# Patient Record
Sex: Male | Born: 2005 | Race: Black or African American | Hispanic: No | Marital: Single | Smoking: Never smoker
Health system: Southern US, Community
[De-identification: ages and names within clinical notes are randomized; demographics above are authoritative.]

## PROBLEM LIST (undated history)

## (undated) DIAGNOSIS — R569 Unspecified convulsions: Secondary | ICD-10-CM

## (undated) DIAGNOSIS — F319 Bipolar disorder, unspecified: Secondary | ICD-10-CM

## (undated) DIAGNOSIS — F840 Autistic disorder: Secondary | ICD-10-CM

## (undated) DIAGNOSIS — R519 Headache, unspecified: Secondary | ICD-10-CM

## (undated) DIAGNOSIS — F209 Schizophrenia, unspecified: Secondary | ICD-10-CM

## (undated) HISTORY — PX: CIRCUMCISION: SHX1350

---
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
## 2023-03-10 ENCOUNTER — Emergency Department (HOSPITAL_COMMUNITY)
Admission: EM | Admit: 2023-03-10 | Discharge: 2023-03-11 | Disposition: A | Discharge status: Other Acute Inpatient Hospital | Payer: MEDICAID | Attending: Emergency Medicine | Admitting: Emergency Medicine

## 2023-03-10 ENCOUNTER — Other Ambulatory Visit: Payer: Self-pay

## 2023-03-10 ENCOUNTER — Encounter (HOSPITAL_COMMUNITY): Payer: Self-pay | Admitting: *Deleted

## 2023-03-10 DIAGNOSIS — F200 Paranoid schizophrenia: Secondary | ICD-10-CM | POA: Diagnosis not present

## 2023-03-10 DIAGNOSIS — R4689 Other symptoms and signs involving appearance and behavior: Secondary | ICD-10-CM | POA: Insufficient documentation

## 2023-03-10 LAB — COMPREHENSIVE METABOLIC PANEL
ALT: 88 U/L — ABNORMAL HIGH (ref 0–44)
AST: 64 U/L — ABNORMAL HIGH (ref 15–41)
Albumin: 4.4 g/dL (ref 3.5–5.0)
Alkaline Phosphatase: 79 U/L (ref 38–126)
Anion gap: 11 (ref 5–15)
BUN: 10 mg/dL (ref 6–20)
CO2: 21 mmol/L — ABNORMAL LOW (ref 22–32)
Calcium: 9.4 mg/dL (ref 8.9–10.3)
Chloride: 104 mmol/L (ref 98–111)
Creatinine, Ser: 1.3 mg/dL — ABNORMAL HIGH (ref 0.61–1.24)
GFR, Estimated: >60 mL/min (ref >60)
Glucose, Bld: 103 mg/dL — ABNORMAL HIGH (ref 70–99)
Potassium: 3.4 mmol/L — ABNORMAL LOW (ref 3.5–5.1)
Sodium: 136 mmol/L (ref 135–145)
Total Bilirubin: 0.6 mg/dL (ref 0.0–1.2)
Total Protein: 7.9 g/dL (ref 6.5–8.1)

## 2023-03-10 LAB — CBC WITH DIFFERENTIAL/PLATELET
Abs Immature Granulocytes: 0.01 10*3/uL (ref 0.00–0.07)
Basophils Absolute: 0.1 10*3/uL (ref 0.0–0.1)
Basophils Relative: 1 %
Eosinophils Absolute: 0.1 10*3/uL (ref 0.0–0.5)
Eosinophils Relative: 1 %
HCT: 45.6 % (ref 39.0–52.0)
Hemoglobin: 15.9 g/dL (ref 13.0–17.0)
Immature Granulocytes: 0 %
Lymphocytes Relative: 29 %
Lymphs Abs: 2 10*3/uL (ref 0.7–4.0)
MCH: 29.6 pg (ref 26.0–34.0)
MCHC: 34.9 g/dL (ref 30.0–36.0)
MCV: 84.9 fL (ref 80.0–100.0)
Monocytes Absolute: 0.5 10*3/uL (ref 0.1–1.0)
Monocytes Relative: 7 %
Neutro Abs: 4.2 10*3/uL (ref 1.7–7.7)
Neutrophils Relative %: 62 %
Platelets: 363 10*3/uL (ref 150–400)
RBC: 5.37 MIL/uL (ref 4.22–5.81)
RDW: 12.4 % (ref 11.5–15.5)
WBC: 6.8 10*3/uL (ref 4.0–10.5)
nRBC: 0 % (ref 0.0–0.2)

## 2023-03-10 LAB — RAPID URINE DRUG SCREEN, HOSP PERFORMED
Amphetamines: NOT DETECTED
Barbiturates: NOT DETECTED
Benzodiazepines: NOT DETECTED
Cocaine: NOT DETECTED
Opiates: NOT DETECTED
Tetrahydrocannabinol: POSITIVE — AB

## 2023-03-10 LAB — ETHANOL: Alcohol, Ethyl (B): <10 mg/dL (ref <10)

## 2023-03-10 LAB — ACETAMINOPHEN LEVEL: Acetaminophen (Tylenol), Serum: <10 ug/mL — ABNORMAL LOW (ref 10–30)

## 2023-03-10 LAB — SALICYLATE LEVEL: Salicylate Lvl: <7 mg/dL — ABNORMAL LOW (ref 7.0–30.0)

## 2023-03-10 MED ORDER — RISPERIDONE 3 MG PO TABS
3.0000 mg | ORAL_TABLET | Freq: Every day | ORAL | Status: DC
  Administered 2023-03-10: 3 mg via ORAL
  Filled 2023-03-10 (×2): qty 1

## 2023-03-10 MED ORDER — ZIPRASIDONE MESYLATE 20 MG IM SOLR
20.0000 mg | Freq: Once | INTRAMUSCULAR | Status: AC
Start: 2023-03-10 — End: 2023-03-10
  Administered 2023-03-10: 20 mg via INTRAMUSCULAR
  Filled 2023-03-10: qty 20

## 2023-03-10 MED ORDER — HYDROXYZINE HCL 10 MG PO TABS: 10.0000 mg | ORAL_TABLET | Freq: Every day | ORAL | Status: DC | PRN

## 2023-03-10 MED ORDER — LORAZEPAM 2 MG/ML IJ SOLN
4.0000 mg | Freq: Once | INTRAMUSCULAR | Status: AC
  Administered 2023-03-10: 4 mg via INTRAMUSCULAR

## 2023-03-10 MED ORDER — ZIPRASIDONE MESYLATE 20 MG IM SOLR
20.0000 mg | Freq: Once | INTRAMUSCULAR | Status: AC | PRN
  Administered 2023-03-10: 20 mg via INTRAMUSCULAR
  Filled 2023-03-10: qty 20

## 2023-03-10 MED ORDER — HYDROXYZINE HCL 25 MG PO TABS: 25.0000 mg | ORAL_TABLET | Freq: Every day | ORAL | Status: DC | PRN

## 2023-03-10 MED ORDER — STERILE WATER FOR INJECTION IJ SOLN
INTRAMUSCULAR | Status: AC
  Administered 2023-03-10: 10 mL
  Filled 2023-03-10: qty 10

## 2023-03-10 MED ORDER — IBUPROFEN 400 MG PO TABS: 400.0000 mg | ORAL_TABLET | Freq: Four times a day (QID) | ORAL | Status: DC | PRN

## 2023-03-10 MED ORDER — LORAZEPAM 2 MG/ML IJ SOLN
INTRAMUSCULAR | Status: AC
  Administered 2023-03-10: 4 mg via INTRAMUSCULAR
  Filled 2023-03-10: qty 3

## 2023-03-10 MED ORDER — LORAZEPAM 2 MG/ML IJ SOLN: 4.0000 mg | Freq: Once | INTRAMUSCULAR | Status: DC

## 2023-03-10 MED ORDER — TRAZODONE HCL 50 MG PO TABS
50.0000 mg | ORAL_TABLET | Freq: Every evening | ORAL | Status: DC | PRN
  Administered 2023-03-10: 100 mg via ORAL
  Filled 2023-03-10: qty 2

## 2023-03-10 NOTE — ED Notes (Signed)
Patient asking for food. Tech offered sandwich and graham crackers. Patient states "I do not eat processed foods". RN explains the options we have to eat down here and patient states "I guess I will not eat then".

## 2023-03-10 NOTE — ED Notes (Signed)
Pt mother called and pt refused to talk to her on the desk phone, mother advised she would call back later.

## 2023-03-10 NOTE — Progress Notes (Signed)
[name] Progress Note  [id number]   Joseph Key  03/10/2023  1:56 PM  Description:   Inpatient Psychiatric Referral  Patient was recommended inpatient per [name]. There are no available beds at [facility], per [facility] [name]. Patient was referred to the following out of network facilities:   Destination  Service Provider Address Phone Fax  [facility] [address] [place] [address] [phone]  [facility] [address], [place] [address] [phone]  [facility] [address] [place] [address] [phone]  [facility] [address] [place], [place] [address] [phone]  [facility] [address] [place] [address] [phone]  [facility] [address] [place] [address] [phone]  [facility] [place] [address], [place] [address] [phone]  [facility] [address] [place] [address] [phone]  [facility] [address] [place] [address] [phone]  [facility] EFAX [address] [place] [phone]  [facility] [address] [place] [address] [phone]  [facility] [address] [place] [address] [phone]  [facility] [address] [place], [place] [address] [phone]      Situation ongoing, CSW to continue following and update chart as more information becomes available.      [name], [name], [name]  03/10/2023 1:56 PM
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
## 2023-03-10 NOTE — Progress Notes (Addendum)
Pt was accepted to [facility] TOMORROW 03/11/2023; Bed Assignment [facility] Fax Number: [phone] (Adult)  Pt meets inpatient criteria per Madelin Rear  Attending Physician will be Dr. Loni Beckwith   Report can be called [phone]-Pager number, please leave a returned phone number to receive a phone call back.   Pt can arrive after 8:30am   Care Team notified: Yetta Barre  Maryjean Ka, [name], [facility] 03/10/2023 6:37 PM
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
## 2023-03-10 NOTE — ED Notes (Signed)
Mother updated on pt being accepted at [facility].
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
## 2023-03-10 NOTE — BH Assessment (Signed)
TTS Consult will be completed by IRIS. IRIS Coordinator will communicate in established chat assessment time and provider name. Thanks

## 2023-03-10 NOTE — ED Notes (Signed)
ETA for TTS is 08:30 AM EST

## 2023-03-10 NOTE — ED Notes (Signed)
Pt changed into purple scrubs; cooperative at present  Trazodone Risperidone
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
## 2023-03-10 NOTE — ED Notes (Signed)
NT gave Hall-[address] a [place] sandwich and a cup of ice water.

## 2023-03-10 NOTE — ED Notes (Signed)
IVC COMPLETE EXP 2/4 3 COPIES ATTACHED TO CLIPBOARD IN BLUE ZONE ORIGINAL IN RED FOLDER AND ONE IN MEDICAL RECORDS.

## 2023-03-10 NOTE — ED Notes (Signed)
Dr. Leanna Sato, EDP requested to pt room approx. 9:25pm d/t pt continuing to escalate behavior. Pt initially slammed the door to his room because "I can't use that bathroom, I want to go to one that locks" and also because "if my mom can't come up here and get me, then I don't want to talk to her anymore." Pt given time to attempt to de-escalate in his room for approx. 15 minutes, however, when the NT went to attempt to obtain pt vital signs, NT discovered that pt had blocked his door to his room not allowing staff access to the pt or his room. Security and [employer] requested to RM 50 for further assistance, pt hospital bed discovered by PD pushed all the way against the door of pt room. Pt informed that this is not allowed for him d/t safety issues and pt is under IVC. Discussed with provider regarding least restrictive measures to further de-escalate pt behavior and IM Geodon vor received x1 dose. Pt compliant and allowed this nurse to administer medication without any issues at this time. Pt hospital bed frame removed from pt room by PD and security at this nurse's request, mattress and bed linens left for pt to sleep. After this nurse exited pt room, pt observed attempting to move his mattress in various places around his room, throwing various items including blankets around his room. When attempt made to redirect pt, pt very argumentative, pt provided reassurance that requests are only for his safety, pt refusing to comply with any verbal requests at this time. Pt to be reassessed in approx. 30 minutes and provider update at that time on pt behaviors.

## 2023-03-10 NOTE — ED Notes (Signed)
Pt given [place] sandwich

## 2023-03-10 NOTE — ED Notes (Signed)
Patient requesting graham crackers to eat. Tech supplied graham crackers

## 2023-03-10 NOTE — ED Notes (Signed)
Pt brought in accompanied by [employer]. Pt sitting in 2B at this time.

## 2023-03-10 NOTE — ED Notes (Signed)
Pt mother and brother at beside

## 2023-03-10 NOTE — ED Notes (Signed)
Alex, RN asks the patient if he has to urinate. Denies at this time

## 2023-03-10 NOTE — ED Notes (Signed)
Per MD-Bero will work on [employer] paperwork for this patient

## 2023-03-10 NOTE — ED Notes (Signed)
Informed by unit Counselling psychologist staff that pt mother has been making an attempt to talk to the pt on the phone. This nurse made attempt to have pt verbalize if he is okay speaking to his mother at this time or uncomfortable, pt states "yeah, I'll talk to her, but I don't want to talk to her on that wired phone out there." Pt informed there is no "wire" on the nurse's station phone and that when is ready to speak to his mother this nurse willing to call her so he can speak to her, pt okay with this. Pt also informed this RN he washed up in the bathroom, pt was informed of pt shower and offered opportunity to shower, pt declines and requests only clean clothing at this time. Pt room has food and crayons all over the floor, bedding covered with various food stains at this time, pt initially also had bedside table pushed against the door to block entry of staff. Power cords to pt bed also stretched across his bed and pt laying on his abdomen with blanket over his head and cords stretched beneath his body. Cords removed from under the pt and replaced to outlet in the wall at this time. Attempts made to tidy pt room declined by pt at this time. Pt provided limit setting by this nurse for safety for reason bedside table cannot be blocking staff entry to room, and that outlet cords will need to remain in the wall. Pt informed if he is unable to comply with safety in his room pt informed alternative arrangements to be made at that time. Pt offered distraction with television and/or word search/puzzles, pt declines states "tv is boring." Pt continues to remain lying on his abdomen in bed with his head covered at this time.

## 2023-03-10 NOTE — ED Notes (Signed)
Patient states he wants his hydroxyzine that he takes at home. RN tells patient that the provider ordered the medicine and if we want we can give it to him. Patient states "No I want my medicine from my home I don' trust you guys". Trinna Post, RN educates patient why we are not allowed to do that. Patient states then "I don't want the medicine get out of my face".

## 2023-03-11 MED ORDER — ZIPRASIDONE MESYLATE 20 MG IM SOLR
20.0000 mg | Freq: Once | INTRAMUSCULAR | Status: AC
  Administered 2023-03-11: 20 mg via INTRAMUSCULAR
  Filled 2023-03-11: qty 20

## 2023-03-11 MED ORDER — STERILE WATER FOR INJECTION IJ SOLN
INTRAMUSCULAR | Status: AC
  Administered 2023-03-11: 10 mL
  Filled 2023-03-11: qty 10

## 2023-03-11 NOTE — ED Notes (Addendum)
Sheriff transported patient to [facility] at this time.

## 2023-03-11 NOTE — ED Notes (Signed)
Mother called attempting to visit patient. Informed her that patient is currently calm and sleeping and that he may not have a visitor right now d/t agitation requiring medication earlier this AM. Mother became upset on the phone, stating she is on her way to the hospital and she will be coming to see her son. Informed that she will not be allowed back to see him if she comes right now since he is resting and calm. Patient's mother hung up on this RN.

## 2023-03-11 NOTE — ED Provider Notes (Signed)
Emergency Medicine Observation Re-evaluation Note  Xzaviar Maloof is a 18 y.o. male, seen on rounds today.  Pt initially presented to the ED for complaints of Medical Clearance Currently, the patient is resting comfortably.  Physical Exam  BP 118/66 (BP Location: Left Arm)   Pulse 74   Temp 97.8 F (36.6 C) (Oral)   Resp 18   Ht 5\' 8"  (1.727 m)   Wt 122.5 kg   SpO2 97%   BMI 41.05 kg/m  Physical Exam General: NAD Cardiac: no pallor Lungs: bilateral chest rise, no tachypnea Psych: resting comfortably  ED Course / MDM  EKG:   I have reviewed the labs performed to date as well as medications administered while in observation.  Recent changes in the last 24 hours include Given geodon for agitation.  Resting comfortably.  Hopefully to [facility] this morning.  Plan  Current plan is for Psych placement.  Patient remains in stable condition.  He was transported to [facility], [place], [place] 03/11/23 1120

## 2023-03-11 NOTE — ED Notes (Signed)
[facility] called, number left with pager system for call back for report.

## 2023-03-11 NOTE — ED Notes (Signed)
Pt to nursing station asking to call his mother. Pt using phone at desk.

## 2023-03-11 NOTE — ED Notes (Signed)
Spoke with [facility] intake, cannot accept patient until four hours post IM medication. Last IM at 0617. Will attempt report again closer to 4 hour mark.

## 2023-03-11 NOTE — ED Notes (Signed)
Pt slept from approx. 10pm-0430am with no issues, as soon as pt woke up he began requesting to call his mother. Pt informed of unit phone times and verbally redirected that 0530am is not within those hours. Pt continued to escalate his behaviors. Pt laying underneath his mattress on the floor, then sat the mattress up on its side in an attempt to barricade himself behind the mattress in the corner of his room where he was no longer visible to this nurse or sitter. Pt requested to lay the mattress flat for safety purposes, pt continued to verbally escalate and become verbally aggressive with this nurse shouting at this nurse "I don't care what you want! Get out of here and leave me alone!" Pt mattress laid flat on the ground by this nurse and pt continued to be verbally aggressive, pt informed that he can keep his mattress to lay on but he has to be visible to staff for safety. Pt became angry and followed this nurse out of the room with his mattress and threw it against the nurse's station desk, stating "here! Take my bed then!" Pt then began slamming the door to his room and disrupting other pts in the unit, attempts made to discern pt reason for verbal aggression and pt states "I take meds for it! I need my meds!" Attempt made to determine which medication pt needed at this time unsuccessful, pt continued to yell, and repeatedly slamming the door to his room shut despite multiple attempts to verbally redirect the pt and keep the door open for visibility and safety were also unsuccessful. Dr. Preston Fleeting EDP informed that pt behaviors beginning to escalate and IM Geodon was administered, after pt insisted [employer] place him in handcuffs prior to medication administration. Pt then shouting at [facility] after being medicated stating repeatedly "I'll have all your badges tomorrow!" Then screaming at this nurse "I hope you go straight to hell and burn!" "You need to rot and burn in hell where you belong!" Pt then slammed his door  again, pt allowed time to de-escalate further and security and [employer] remain in the unit for any continued aggressive outbursts from pt.

## 2023-03-11 NOTE — ED Provider Notes (Signed)
Patient had been sleeping following ziprasidone injection, woke up and became increasingly agitated and aggressive to staff.  I have ordered another dose of ziprasidone.  This is both for patient's safety and staff safety.  Of note, he is scheduled to be transferred to [facility] later this morning.   Dione Booze, MD 03/11/23 [phone]

## 2023-06-15 ENCOUNTER — Ambulatory Visit (HOSPITAL_COMMUNITY)
Admission: RE | Admit: 2023-06-15 | Discharge: 2023-06-15 | Disposition: A | Discharge status: Home / Self Care | Payer: MEDICAID | Source: Ambulatory Visit | Attending: Family Medicine | Admitting: Family Medicine

## 2023-06-15 ENCOUNTER — Encounter (HOSPITAL_COMMUNITY): Payer: Self-pay

## 2023-06-15 VITALS — BP 112/78 | HR 78 | Temp 98.0°F | Resp 20

## 2023-06-15 DIAGNOSIS — R197 Diarrhea, unspecified: Secondary | ICD-10-CM | POA: Diagnosis not present

## 2023-06-15 DIAGNOSIS — L210 Seborrhea capitis: Secondary | ICD-10-CM

## 2023-06-15 DIAGNOSIS — Z77120 Contact with and (suspected) exposure to mold (toxic): Secondary | ICD-10-CM | POA: Diagnosis not present

## 2023-06-15 HISTORY — DX: Autistic disorder: F84.0

## 2023-06-15 HISTORY — DX: Bipolar disorder, unspecified: F31.9

## 2023-06-15 MED ORDER — KETOCONAZOLE 2 % EX SHAM
1.0000 | MEDICATED_SHAMPOO | CUTANEOUS | 1 refills | Status: AC
Start: 2023-06-15 — End: ?
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
## 2023-06-15 NOTE — ED Triage Notes (Signed)
 Pt reports at night will itch on side he lays on. Pt also reports diarrhea. All these symptoms for a couple weeks. Exposed to mold

## 2023-06-15 NOTE — Discharge Instructions (Signed)
 I believe your diarrhea may be related to your Depakote.  Please have a follow-up with a primary care doctor or psychiatrist to discuss the medication.  I believe your scalp is itchy due to dandruff.  I have prescribed you a medicated shampoo that you should use at least twice a week.  Please apply the shampoo to your hair when it is slightly wet and leave it on for at least 5 to 10 minutes and then wash it off.  Please use your Flonase  2 sprays each nostril nightly for the next 3 to 4 weeks
